# Patient Record
Sex: Female | Born: 1935 | Race: Black or African American | Hispanic: No | Marital: Married | Smoking: Never smoker
Health system: Southern US, Community
[De-identification: ages and names within clinical notes are randomized; demographics above are authoritative.]

## PROBLEM LIST (undated history)

## (undated) DIAGNOSIS — I10 Essential (primary) hypertension: Secondary | ICD-10-CM

---

## 2015-12-20 ENCOUNTER — Encounter (INDEPENDENT_AMBULATORY_CARE_PROVIDER_SITE_OTHER): Payer: Medicare HMO | Admitting: Ophthalmology

## 2015-12-20 DIAGNOSIS — E113291 Type 2 diabetes mellitus with mild nonproliferative diabetic retinopathy without macular edema, right eye: Secondary | ICD-10-CM

## 2015-12-20 DIAGNOSIS — E113312 Type 2 diabetes mellitus with moderate nonproliferative diabetic retinopathy with macular edema, left eye: Secondary | ICD-10-CM | POA: Diagnosis not present

## 2015-12-20 DIAGNOSIS — E11311 Type 2 diabetes mellitus with unspecified diabetic retinopathy with macular edema: Secondary | ICD-10-CM | POA: Diagnosis not present

## 2015-12-20 DIAGNOSIS — H43813 Vitreous degeneration, bilateral: Secondary | ICD-10-CM

## 2015-12-20 DIAGNOSIS — I10 Essential (primary) hypertension: Secondary | ICD-10-CM | POA: Diagnosis not present

## 2015-12-20 DIAGNOSIS — H353111 Nonexudative age-related macular degeneration, right eye, early dry stage: Secondary | ICD-10-CM

## 2015-12-20 DIAGNOSIS — H35033 Hypertensive retinopathy, bilateral: Secondary | ICD-10-CM | POA: Diagnosis not present

## 2016-01-17 ENCOUNTER — Encounter (INDEPENDENT_AMBULATORY_CARE_PROVIDER_SITE_OTHER): Payer: Medicare HMO | Admitting: Ophthalmology

## 2016-01-17 DIAGNOSIS — I10 Essential (primary) hypertension: Secondary | ICD-10-CM | POA: Diagnosis not present

## 2016-01-17 DIAGNOSIS — H35033 Hypertensive retinopathy, bilateral: Secondary | ICD-10-CM

## 2016-01-17 DIAGNOSIS — H43813 Vitreous degeneration, bilateral: Secondary | ICD-10-CM

## 2016-01-17 DIAGNOSIS — E11311 Type 2 diabetes mellitus with unspecified diabetic retinopathy with macular edema: Secondary | ICD-10-CM | POA: Diagnosis not present

## 2016-01-17 DIAGNOSIS — E113312 Type 2 diabetes mellitus with moderate nonproliferative diabetic retinopathy with macular edema, left eye: Secondary | ICD-10-CM

## 2016-01-17 DIAGNOSIS — E113291 Type 2 diabetes mellitus with mild nonproliferative diabetic retinopathy without macular edema, right eye: Secondary | ICD-10-CM

## 2016-02-15 ENCOUNTER — Encounter (INDEPENDENT_AMBULATORY_CARE_PROVIDER_SITE_OTHER): Payer: Medicare HMO | Admitting: Ophthalmology

## 2016-02-15 DIAGNOSIS — I10 Essential (primary) hypertension: Secondary | ICD-10-CM

## 2016-02-15 DIAGNOSIS — H43813 Vitreous degeneration, bilateral: Secondary | ICD-10-CM | POA: Diagnosis not present

## 2016-02-15 DIAGNOSIS — E11311 Type 2 diabetes mellitus with unspecified diabetic retinopathy with macular edema: Secondary | ICD-10-CM

## 2016-02-15 DIAGNOSIS — E113313 Type 2 diabetes mellitus with moderate nonproliferative diabetic retinopathy with macular edema, bilateral: Secondary | ICD-10-CM

## 2016-02-15 DIAGNOSIS — H35033 Hypertensive retinopathy, bilateral: Secondary | ICD-10-CM

## 2016-03-17 ENCOUNTER — Encounter (INDEPENDENT_AMBULATORY_CARE_PROVIDER_SITE_OTHER): Payer: Medicare HMO | Admitting: Ophthalmology

## 2016-03-17 DIAGNOSIS — I10 Essential (primary) hypertension: Secondary | ICD-10-CM | POA: Diagnosis not present

## 2016-03-17 DIAGNOSIS — E113312 Type 2 diabetes mellitus with moderate nonproliferative diabetic retinopathy with macular edema, left eye: Secondary | ICD-10-CM | POA: Diagnosis not present

## 2016-03-17 DIAGNOSIS — E11311 Type 2 diabetes mellitus with unspecified diabetic retinopathy with macular edema: Secondary | ICD-10-CM | POA: Diagnosis not present

## 2016-03-17 DIAGNOSIS — E113291 Type 2 diabetes mellitus with mild nonproliferative diabetic retinopathy without macular edema, right eye: Secondary | ICD-10-CM | POA: Diagnosis not present

## 2016-03-17 DIAGNOSIS — H35033 Hypertensive retinopathy, bilateral: Secondary | ICD-10-CM

## 2016-03-17 DIAGNOSIS — H43813 Vitreous degeneration, bilateral: Secondary | ICD-10-CM

## 2016-04-14 ENCOUNTER — Encounter (INDEPENDENT_AMBULATORY_CARE_PROVIDER_SITE_OTHER): Payer: Medicare HMO | Admitting: Ophthalmology

## 2016-04-14 DIAGNOSIS — I10 Essential (primary) hypertension: Secondary | ICD-10-CM

## 2016-04-14 DIAGNOSIS — E113312 Type 2 diabetes mellitus with moderate nonproliferative diabetic retinopathy with macular edema, left eye: Secondary | ICD-10-CM

## 2016-04-14 DIAGNOSIS — H43813 Vitreous degeneration, bilateral: Secondary | ICD-10-CM | POA: Diagnosis not present

## 2016-04-14 DIAGNOSIS — E11311 Type 2 diabetes mellitus with unspecified diabetic retinopathy with macular edema: Secondary | ICD-10-CM

## 2016-04-14 DIAGNOSIS — H35033 Hypertensive retinopathy, bilateral: Secondary | ICD-10-CM

## 2016-04-14 DIAGNOSIS — E113291 Type 2 diabetes mellitus with mild nonproliferative diabetic retinopathy without macular edema, right eye: Secondary | ICD-10-CM | POA: Diagnosis not present

## 2016-05-12 ENCOUNTER — Encounter (INDEPENDENT_AMBULATORY_CARE_PROVIDER_SITE_OTHER): Payer: Medicare HMO | Admitting: Ophthalmology

## 2016-05-12 DIAGNOSIS — E113291 Type 2 diabetes mellitus with mild nonproliferative diabetic retinopathy without macular edema, right eye: Secondary | ICD-10-CM | POA: Diagnosis not present

## 2016-05-12 DIAGNOSIS — E11311 Type 2 diabetes mellitus with unspecified diabetic retinopathy with macular edema: Secondary | ICD-10-CM

## 2016-05-12 DIAGNOSIS — E113312 Type 2 diabetes mellitus with moderate nonproliferative diabetic retinopathy with macular edema, left eye: Secondary | ICD-10-CM | POA: Diagnosis not present

## 2016-05-12 DIAGNOSIS — H43813 Vitreous degeneration, bilateral: Secondary | ICD-10-CM

## 2016-05-12 DIAGNOSIS — I10 Essential (primary) hypertension: Secondary | ICD-10-CM

## 2016-05-12 DIAGNOSIS — H35033 Hypertensive retinopathy, bilateral: Secondary | ICD-10-CM

## 2016-06-23 ENCOUNTER — Encounter (INDEPENDENT_AMBULATORY_CARE_PROVIDER_SITE_OTHER): Payer: Medicare HMO | Admitting: Ophthalmology

## 2016-06-23 DIAGNOSIS — E11311 Type 2 diabetes mellitus with unspecified diabetic retinopathy with macular edema: Secondary | ICD-10-CM | POA: Diagnosis not present

## 2016-06-23 DIAGNOSIS — I10 Essential (primary) hypertension: Secondary | ICD-10-CM | POA: Diagnosis not present

## 2016-06-23 DIAGNOSIS — E113312 Type 2 diabetes mellitus with moderate nonproliferative diabetic retinopathy with macular edema, left eye: Secondary | ICD-10-CM

## 2016-06-23 DIAGNOSIS — H35033 Hypertensive retinopathy, bilateral: Secondary | ICD-10-CM | POA: Diagnosis not present

## 2016-06-23 DIAGNOSIS — E113211 Type 2 diabetes mellitus with mild nonproliferative diabetic retinopathy with macular edema, right eye: Secondary | ICD-10-CM | POA: Diagnosis not present

## 2016-06-23 DIAGNOSIS — H43813 Vitreous degeneration, bilateral: Secondary | ICD-10-CM | POA: Diagnosis not present

## 2016-08-07 ENCOUNTER — Encounter (INDEPENDENT_AMBULATORY_CARE_PROVIDER_SITE_OTHER): Payer: Medicare HMO | Admitting: Ophthalmology

## 2016-08-07 DIAGNOSIS — H43813 Vitreous degeneration, bilateral: Secondary | ICD-10-CM

## 2016-08-07 DIAGNOSIS — I10 Essential (primary) hypertension: Secondary | ICD-10-CM | POA: Diagnosis not present

## 2016-08-07 DIAGNOSIS — E11311 Type 2 diabetes mellitus with unspecified diabetic retinopathy with macular edema: Secondary | ICD-10-CM

## 2016-08-07 DIAGNOSIS — E113312 Type 2 diabetes mellitus with moderate nonproliferative diabetic retinopathy with macular edema, left eye: Secondary | ICD-10-CM | POA: Diagnosis not present

## 2016-08-07 DIAGNOSIS — H35033 Hypertensive retinopathy, bilateral: Secondary | ICD-10-CM

## 2016-08-07 DIAGNOSIS — E113211 Type 2 diabetes mellitus with mild nonproliferative diabetic retinopathy with macular edema, right eye: Secondary | ICD-10-CM | POA: Diagnosis not present

## 2016-09-21 ENCOUNTER — Encounter (INDEPENDENT_AMBULATORY_CARE_PROVIDER_SITE_OTHER): Payer: Medicare HMO | Admitting: Ophthalmology

## 2016-09-21 DIAGNOSIS — E11311 Type 2 diabetes mellitus with unspecified diabetic retinopathy with macular edema: Secondary | ICD-10-CM

## 2016-09-21 DIAGNOSIS — H35033 Hypertensive retinopathy, bilateral: Secondary | ICD-10-CM

## 2016-09-21 DIAGNOSIS — H43813 Vitreous degeneration, bilateral: Secondary | ICD-10-CM | POA: Diagnosis not present

## 2016-09-21 DIAGNOSIS — I10 Essential (primary) hypertension: Secondary | ICD-10-CM | POA: Diagnosis not present

## 2016-09-21 DIAGNOSIS — E113312 Type 2 diabetes mellitus with moderate nonproliferative diabetic retinopathy with macular edema, left eye: Secondary | ICD-10-CM

## 2016-11-02 ENCOUNTER — Encounter (INDEPENDENT_AMBULATORY_CARE_PROVIDER_SITE_OTHER): Payer: Medicare HMO | Admitting: Ophthalmology

## 2016-11-02 DIAGNOSIS — E113391 Type 2 diabetes mellitus with moderate nonproliferative diabetic retinopathy without macular edema, right eye: Secondary | ICD-10-CM | POA: Diagnosis not present

## 2016-11-02 DIAGNOSIS — H43813 Vitreous degeneration, bilateral: Secondary | ICD-10-CM | POA: Diagnosis not present

## 2016-11-02 DIAGNOSIS — E113312 Type 2 diabetes mellitus with moderate nonproliferative diabetic retinopathy with macular edema, left eye: Secondary | ICD-10-CM | POA: Diagnosis not present

## 2016-11-02 DIAGNOSIS — H35033 Hypertensive retinopathy, bilateral: Secondary | ICD-10-CM

## 2016-11-02 DIAGNOSIS — I10 Essential (primary) hypertension: Secondary | ICD-10-CM

## 2016-11-02 DIAGNOSIS — E11311 Type 2 diabetes mellitus with unspecified diabetic retinopathy with macular edema: Secondary | ICD-10-CM

## 2016-12-15 ENCOUNTER — Encounter (INDEPENDENT_AMBULATORY_CARE_PROVIDER_SITE_OTHER): Payer: Medicare HMO | Admitting: Ophthalmology

## 2016-12-15 DIAGNOSIS — E11311 Type 2 diabetes mellitus with unspecified diabetic retinopathy with macular edema: Secondary | ICD-10-CM | POA: Diagnosis not present

## 2016-12-15 DIAGNOSIS — E113312 Type 2 diabetes mellitus with moderate nonproliferative diabetic retinopathy with macular edema, left eye: Secondary | ICD-10-CM

## 2016-12-15 DIAGNOSIS — H35033 Hypertensive retinopathy, bilateral: Secondary | ICD-10-CM

## 2016-12-15 DIAGNOSIS — I10 Essential (primary) hypertension: Secondary | ICD-10-CM

## 2016-12-15 DIAGNOSIS — E113391 Type 2 diabetes mellitus with moderate nonproliferative diabetic retinopathy without macular edema, right eye: Secondary | ICD-10-CM | POA: Diagnosis not present

## 2016-12-15 DIAGNOSIS — H35371 Puckering of macula, right eye: Secondary | ICD-10-CM | POA: Diagnosis not present

## 2016-12-15 DIAGNOSIS — H43813 Vitreous degeneration, bilateral: Secondary | ICD-10-CM | POA: Diagnosis not present

## 2017-01-26 ENCOUNTER — Encounter (INDEPENDENT_AMBULATORY_CARE_PROVIDER_SITE_OTHER): Payer: Medicare HMO | Admitting: Ophthalmology

## 2017-02-01 ENCOUNTER — Encounter (INDEPENDENT_AMBULATORY_CARE_PROVIDER_SITE_OTHER): Payer: Medicare HMO | Admitting: Ophthalmology

## 2017-02-01 DIAGNOSIS — I10 Essential (primary) hypertension: Secondary | ICD-10-CM | POA: Diagnosis not present

## 2017-02-01 DIAGNOSIS — H43813 Vitreous degeneration, bilateral: Secondary | ICD-10-CM

## 2017-02-01 DIAGNOSIS — H35033 Hypertensive retinopathy, bilateral: Secondary | ICD-10-CM

## 2017-02-01 DIAGNOSIS — E113392 Type 2 diabetes mellitus with moderate nonproliferative diabetic retinopathy without macular edema, left eye: Secondary | ICD-10-CM

## 2017-02-01 DIAGNOSIS — E113291 Type 2 diabetes mellitus with mild nonproliferative diabetic retinopathy without macular edema, right eye: Secondary | ICD-10-CM

## 2017-02-01 DIAGNOSIS — E11311 Type 2 diabetes mellitus with unspecified diabetic retinopathy with macular edema: Secondary | ICD-10-CM | POA: Diagnosis not present

## 2017-03-15 ENCOUNTER — Encounter (INDEPENDENT_AMBULATORY_CARE_PROVIDER_SITE_OTHER): Payer: Medicare HMO | Admitting: Ophthalmology

## 2017-03-15 DIAGNOSIS — E113312 Type 2 diabetes mellitus with moderate nonproliferative diabetic retinopathy with macular edema, left eye: Secondary | ICD-10-CM | POA: Diagnosis not present

## 2017-03-15 DIAGNOSIS — H35033 Hypertensive retinopathy, bilateral: Secondary | ICD-10-CM | POA: Diagnosis not present

## 2017-03-15 DIAGNOSIS — I10 Essential (primary) hypertension: Secondary | ICD-10-CM | POA: Diagnosis not present

## 2017-03-15 DIAGNOSIS — E11311 Type 2 diabetes mellitus with unspecified diabetic retinopathy with macular edema: Secondary | ICD-10-CM

## 2017-03-15 DIAGNOSIS — E113291 Type 2 diabetes mellitus with mild nonproliferative diabetic retinopathy without macular edema, right eye: Secondary | ICD-10-CM

## 2017-03-15 DIAGNOSIS — H43813 Vitreous degeneration, bilateral: Secondary | ICD-10-CM | POA: Diagnosis not present

## 2017-04-26 ENCOUNTER — Encounter (INDEPENDENT_AMBULATORY_CARE_PROVIDER_SITE_OTHER): Payer: Medicare HMO | Admitting: Ophthalmology

## 2017-07-05 ENCOUNTER — Encounter (INDEPENDENT_AMBULATORY_CARE_PROVIDER_SITE_OTHER): Payer: Medicare HMO | Admitting: Ophthalmology

## 2017-07-05 DIAGNOSIS — E11311 Type 2 diabetes mellitus with unspecified diabetic retinopathy with macular edema: Secondary | ICD-10-CM | POA: Diagnosis not present

## 2017-07-05 DIAGNOSIS — E113291 Type 2 diabetes mellitus with mild nonproliferative diabetic retinopathy without macular edema, right eye: Secondary | ICD-10-CM

## 2017-07-05 DIAGNOSIS — I10 Essential (primary) hypertension: Secondary | ICD-10-CM

## 2017-07-05 DIAGNOSIS — H26493 Other secondary cataract, bilateral: Secondary | ICD-10-CM | POA: Diagnosis not present

## 2017-07-05 DIAGNOSIS — E113312 Type 2 diabetes mellitus with moderate nonproliferative diabetic retinopathy with macular edema, left eye: Secondary | ICD-10-CM

## 2017-07-05 DIAGNOSIS — H35033 Hypertensive retinopathy, bilateral: Secondary | ICD-10-CM

## 2017-07-05 DIAGNOSIS — H43813 Vitreous degeneration, bilateral: Secondary | ICD-10-CM

## 2017-08-29 ENCOUNTER — Encounter (INDEPENDENT_AMBULATORY_CARE_PROVIDER_SITE_OTHER): Payer: Medicare HMO | Admitting: Ophthalmology

## 2017-08-29 DIAGNOSIS — E113312 Type 2 diabetes mellitus with moderate nonproliferative diabetic retinopathy with macular edema, left eye: Secondary | ICD-10-CM | POA: Diagnosis not present

## 2017-08-29 DIAGNOSIS — H35033 Hypertensive retinopathy, bilateral: Secondary | ICD-10-CM | POA: Diagnosis not present

## 2017-08-29 DIAGNOSIS — I10 Essential (primary) hypertension: Secondary | ICD-10-CM

## 2017-08-29 DIAGNOSIS — H43813 Vitreous degeneration, bilateral: Secondary | ICD-10-CM | POA: Diagnosis not present

## 2017-08-29 DIAGNOSIS — E11311 Type 2 diabetes mellitus with unspecified diabetic retinopathy with macular edema: Secondary | ICD-10-CM

## 2017-08-29 DIAGNOSIS — E113291 Type 2 diabetes mellitus with mild nonproliferative diabetic retinopathy without macular edema, right eye: Secondary | ICD-10-CM | POA: Diagnosis not present

## 2017-09-26 ENCOUNTER — Encounter (INDEPENDENT_AMBULATORY_CARE_PROVIDER_SITE_OTHER): Payer: Medicare HMO | Admitting: Ophthalmology

## 2017-09-27 ENCOUNTER — Encounter (INDEPENDENT_AMBULATORY_CARE_PROVIDER_SITE_OTHER): Payer: Medicare HMO | Admitting: Ophthalmology

## 2017-10-26 ENCOUNTER — Encounter (INDEPENDENT_AMBULATORY_CARE_PROVIDER_SITE_OTHER): Payer: Medicare HMO | Admitting: Ophthalmology

## 2017-10-26 DIAGNOSIS — H43813 Vitreous degeneration, bilateral: Secondary | ICD-10-CM | POA: Diagnosis not present

## 2017-10-26 DIAGNOSIS — H35372 Puckering of macula, left eye: Secondary | ICD-10-CM

## 2017-10-26 DIAGNOSIS — E11311 Type 2 diabetes mellitus with unspecified diabetic retinopathy with macular edema: Secondary | ICD-10-CM | POA: Diagnosis not present

## 2017-10-26 DIAGNOSIS — H35033 Hypertensive retinopathy, bilateral: Secondary | ICD-10-CM | POA: Diagnosis not present

## 2017-10-26 DIAGNOSIS — I10 Essential (primary) hypertension: Secondary | ICD-10-CM | POA: Diagnosis not present

## 2017-10-26 DIAGNOSIS — E113291 Type 2 diabetes mellitus with mild nonproliferative diabetic retinopathy without macular edema, right eye: Secondary | ICD-10-CM

## 2017-10-26 DIAGNOSIS — E113312 Type 2 diabetes mellitus with moderate nonproliferative diabetic retinopathy with macular edema, left eye: Secondary | ICD-10-CM

## 2017-12-07 ENCOUNTER — Encounter (INDEPENDENT_AMBULATORY_CARE_PROVIDER_SITE_OTHER): Payer: Medicare HMO | Admitting: Ophthalmology

## 2017-12-07 DIAGNOSIS — I10 Essential (primary) hypertension: Secondary | ICD-10-CM | POA: Diagnosis not present

## 2017-12-07 DIAGNOSIS — H26491 Other secondary cataract, right eye: Secondary | ICD-10-CM

## 2017-12-07 DIAGNOSIS — E113291 Type 2 diabetes mellitus with mild nonproliferative diabetic retinopathy without macular edema, right eye: Secondary | ICD-10-CM

## 2017-12-07 DIAGNOSIS — H35033 Hypertensive retinopathy, bilateral: Secondary | ICD-10-CM

## 2017-12-07 DIAGNOSIS — E113212 Type 2 diabetes mellitus with mild nonproliferative diabetic retinopathy with macular edema, left eye: Secondary | ICD-10-CM | POA: Diagnosis not present

## 2017-12-07 DIAGNOSIS — E11311 Type 2 diabetes mellitus with unspecified diabetic retinopathy with macular edema: Secondary | ICD-10-CM

## 2017-12-07 DIAGNOSIS — H43813 Vitreous degeneration, bilateral: Secondary | ICD-10-CM

## 2017-12-19 ENCOUNTER — Encounter (INDEPENDENT_AMBULATORY_CARE_PROVIDER_SITE_OTHER): Payer: Medicare HMO | Admitting: Ophthalmology

## 2018-01-18 ENCOUNTER — Encounter (INDEPENDENT_AMBULATORY_CARE_PROVIDER_SITE_OTHER): Payer: Medicare HMO | Admitting: Ophthalmology

## 2018-01-18 DIAGNOSIS — I10 Essential (primary) hypertension: Secondary | ICD-10-CM

## 2018-01-18 DIAGNOSIS — E113312 Type 2 diabetes mellitus with moderate nonproliferative diabetic retinopathy with macular edema, left eye: Secondary | ICD-10-CM | POA: Diagnosis not present

## 2018-01-18 DIAGNOSIS — H35033 Hypertensive retinopathy, bilateral: Secondary | ICD-10-CM

## 2018-01-18 DIAGNOSIS — E113291 Type 2 diabetes mellitus with mild nonproliferative diabetic retinopathy without macular edema, right eye: Secondary | ICD-10-CM | POA: Diagnosis not present

## 2018-01-18 DIAGNOSIS — E11311 Type 2 diabetes mellitus with unspecified diabetic retinopathy with macular edema: Secondary | ICD-10-CM

## 2018-01-18 DIAGNOSIS — H26493 Other secondary cataract, bilateral: Secondary | ICD-10-CM

## 2018-01-18 DIAGNOSIS — H43813 Vitreous degeneration, bilateral: Secondary | ICD-10-CM

## 2018-02-15 ENCOUNTER — Encounter (INDEPENDENT_AMBULATORY_CARE_PROVIDER_SITE_OTHER): Payer: Medicare HMO | Admitting: Ophthalmology

## 2018-02-15 DIAGNOSIS — E11319 Type 2 diabetes mellitus with unspecified diabetic retinopathy without macular edema: Secondary | ICD-10-CM

## 2018-02-15 DIAGNOSIS — H2701 Aphakia, right eye: Secondary | ICD-10-CM

## 2018-02-15 DIAGNOSIS — E113392 Type 2 diabetes mellitus with moderate nonproliferative diabetic retinopathy without macular edema, left eye: Secondary | ICD-10-CM

## 2018-03-18 ENCOUNTER — Encounter (INDEPENDENT_AMBULATORY_CARE_PROVIDER_SITE_OTHER): Payer: Medicare HMO | Admitting: Ophthalmology

## 2018-03-19 ENCOUNTER — Encounter (INDEPENDENT_AMBULATORY_CARE_PROVIDER_SITE_OTHER): Payer: Medicare HMO | Admitting: Ophthalmology

## 2018-03-19 DIAGNOSIS — E113291 Type 2 diabetes mellitus with mild nonproliferative diabetic retinopathy without macular edema, right eye: Secondary | ICD-10-CM

## 2018-03-19 DIAGNOSIS — I10 Essential (primary) hypertension: Secondary | ICD-10-CM | POA: Diagnosis not present

## 2018-03-19 DIAGNOSIS — H35372 Puckering of macula, left eye: Secondary | ICD-10-CM

## 2018-03-19 DIAGNOSIS — H43813 Vitreous degeneration, bilateral: Secondary | ICD-10-CM

## 2018-03-19 DIAGNOSIS — E11311 Type 2 diabetes mellitus with unspecified diabetic retinopathy with macular edema: Secondary | ICD-10-CM

## 2018-03-19 DIAGNOSIS — E113312 Type 2 diabetes mellitus with moderate nonproliferative diabetic retinopathy with macular edema, left eye: Secondary | ICD-10-CM

## 2018-03-19 DIAGNOSIS — H35033 Hypertensive retinopathy, bilateral: Secondary | ICD-10-CM

## 2018-04-16 ENCOUNTER — Encounter (INDEPENDENT_AMBULATORY_CARE_PROVIDER_SITE_OTHER): Payer: Medicare HMO | Admitting: Ophthalmology

## 2018-04-26 ENCOUNTER — Encounter (INDEPENDENT_AMBULATORY_CARE_PROVIDER_SITE_OTHER): Payer: Medicare HMO | Admitting: Ophthalmology

## 2018-06-27 ENCOUNTER — Encounter (INDEPENDENT_AMBULATORY_CARE_PROVIDER_SITE_OTHER): Payer: Medicare HMO | Admitting: Ophthalmology

## 2018-06-28 ENCOUNTER — Encounter (INDEPENDENT_AMBULATORY_CARE_PROVIDER_SITE_OTHER): Payer: Medicare HMO | Admitting: Ophthalmology

## 2018-06-28 DIAGNOSIS — I10 Essential (primary) hypertension: Secondary | ICD-10-CM

## 2018-06-28 DIAGNOSIS — H35033 Hypertensive retinopathy, bilateral: Secondary | ICD-10-CM

## 2018-06-28 DIAGNOSIS — E113312 Type 2 diabetes mellitus with moderate nonproliferative diabetic retinopathy with macular edema, left eye: Secondary | ICD-10-CM

## 2018-06-28 DIAGNOSIS — E113291 Type 2 diabetes mellitus with mild nonproliferative diabetic retinopathy without macular edema, right eye: Secondary | ICD-10-CM

## 2018-06-28 DIAGNOSIS — E11311 Type 2 diabetes mellitus with unspecified diabetic retinopathy with macular edema: Secondary | ICD-10-CM

## 2018-06-28 DIAGNOSIS — H43813 Vitreous degeneration, bilateral: Secondary | ICD-10-CM

## 2018-09-13 ENCOUNTER — Encounter (INDEPENDENT_AMBULATORY_CARE_PROVIDER_SITE_OTHER): Payer: Medicare HMO | Admitting: Ophthalmology

## 2018-09-13 ENCOUNTER — Other Ambulatory Visit: Payer: Self-pay

## 2018-09-13 DIAGNOSIS — I10 Essential (primary) hypertension: Secondary | ICD-10-CM | POA: Diagnosis not present

## 2018-09-13 DIAGNOSIS — E113312 Type 2 diabetes mellitus with moderate nonproliferative diabetic retinopathy with macular edema, left eye: Secondary | ICD-10-CM | POA: Diagnosis not present

## 2018-09-13 DIAGNOSIS — H43813 Vitreous degeneration, bilateral: Secondary | ICD-10-CM

## 2018-09-13 DIAGNOSIS — E11311 Type 2 diabetes mellitus with unspecified diabetic retinopathy with macular edema: Secondary | ICD-10-CM | POA: Diagnosis not present

## 2018-09-13 DIAGNOSIS — E113391 Type 2 diabetes mellitus with moderate nonproliferative diabetic retinopathy without macular edema, right eye: Secondary | ICD-10-CM | POA: Diagnosis not present

## 2018-09-13 DIAGNOSIS — H35033 Hypertensive retinopathy, bilateral: Secondary | ICD-10-CM

## 2019-03-17 ENCOUNTER — Encounter (INDEPENDENT_AMBULATORY_CARE_PROVIDER_SITE_OTHER): Payer: Medicare HMO | Admitting: Ophthalmology

## 2019-03-17 ENCOUNTER — Other Ambulatory Visit: Payer: Self-pay

## 2019-03-17 DIAGNOSIS — E113312 Type 2 diabetes mellitus with moderate nonproliferative diabetic retinopathy with macular edema, left eye: Secondary | ICD-10-CM | POA: Diagnosis not present

## 2019-03-17 DIAGNOSIS — E113291 Type 2 diabetes mellitus with mild nonproliferative diabetic retinopathy without macular edema, right eye: Secondary | ICD-10-CM | POA: Diagnosis not present

## 2019-03-17 DIAGNOSIS — E11311 Type 2 diabetes mellitus with unspecified diabetic retinopathy with macular edema: Secondary | ICD-10-CM

## 2019-03-17 DIAGNOSIS — H35033 Hypertensive retinopathy, bilateral: Secondary | ICD-10-CM

## 2019-03-17 DIAGNOSIS — H43813 Vitreous degeneration, bilateral: Secondary | ICD-10-CM

## 2019-03-17 DIAGNOSIS — I10 Essential (primary) hypertension: Secondary | ICD-10-CM | POA: Diagnosis not present

## 2019-05-05 ENCOUNTER — Encounter (INDEPENDENT_AMBULATORY_CARE_PROVIDER_SITE_OTHER): Payer: Medicare HMO | Admitting: Ophthalmology

## 2019-05-05 ENCOUNTER — Other Ambulatory Visit: Payer: Self-pay

## 2019-05-05 DIAGNOSIS — I10 Essential (primary) hypertension: Secondary | ICD-10-CM | POA: Diagnosis not present

## 2019-05-05 DIAGNOSIS — E11311 Type 2 diabetes mellitus with unspecified diabetic retinopathy with macular edema: Secondary | ICD-10-CM | POA: Diagnosis not present

## 2019-05-05 DIAGNOSIS — E113291 Type 2 diabetes mellitus with mild nonproliferative diabetic retinopathy without macular edema, right eye: Secondary | ICD-10-CM

## 2019-05-05 DIAGNOSIS — E113212 Type 2 diabetes mellitus with mild nonproliferative diabetic retinopathy with macular edema, left eye: Secondary | ICD-10-CM

## 2019-05-05 DIAGNOSIS — E113211 Type 2 diabetes mellitus with mild nonproliferative diabetic retinopathy with macular edema, right eye: Secondary | ICD-10-CM | POA: Diagnosis not present

## 2019-05-05 DIAGNOSIS — H35033 Hypertensive retinopathy, bilateral: Secondary | ICD-10-CM

## 2019-05-05 DIAGNOSIS — H43813 Vitreous degeneration, bilateral: Secondary | ICD-10-CM

## 2019-06-30 ENCOUNTER — Other Ambulatory Visit: Payer: Self-pay

## 2019-06-30 ENCOUNTER — Encounter (INDEPENDENT_AMBULATORY_CARE_PROVIDER_SITE_OTHER): Payer: Medicare HMO | Admitting: Ophthalmology

## 2019-06-30 DIAGNOSIS — E113291 Type 2 diabetes mellitus with mild nonproliferative diabetic retinopathy without macular edema, right eye: Secondary | ICD-10-CM | POA: Diagnosis not present

## 2019-06-30 DIAGNOSIS — E11311 Type 2 diabetes mellitus with unspecified diabetic retinopathy with macular edema: Secondary | ICD-10-CM

## 2019-06-30 DIAGNOSIS — E113312 Type 2 diabetes mellitus with moderate nonproliferative diabetic retinopathy with macular edema, left eye: Secondary | ICD-10-CM

## 2019-06-30 DIAGNOSIS — H43813 Vitreous degeneration, bilateral: Secondary | ICD-10-CM

## 2019-06-30 DIAGNOSIS — H35033 Hypertensive retinopathy, bilateral: Secondary | ICD-10-CM

## 2019-06-30 DIAGNOSIS — I10 Essential (primary) hypertension: Secondary | ICD-10-CM

## 2019-08-25 ENCOUNTER — Encounter (INDEPENDENT_AMBULATORY_CARE_PROVIDER_SITE_OTHER): Payer: Medicare HMO | Admitting: Ophthalmology

## 2019-08-25 DIAGNOSIS — E11311 Type 2 diabetes mellitus with unspecified diabetic retinopathy with macular edema: Secondary | ICD-10-CM

## 2019-08-25 DIAGNOSIS — I10 Essential (primary) hypertension: Secondary | ICD-10-CM | POA: Diagnosis not present

## 2019-08-25 DIAGNOSIS — H43813 Vitreous degeneration, bilateral: Secondary | ICD-10-CM

## 2019-08-25 DIAGNOSIS — H35033 Hypertensive retinopathy, bilateral: Secondary | ICD-10-CM

## 2019-08-25 DIAGNOSIS — E113291 Type 2 diabetes mellitus with mild nonproliferative diabetic retinopathy without macular edema, right eye: Secondary | ICD-10-CM | POA: Diagnosis not present

## 2019-08-25 DIAGNOSIS — E113212 Type 2 diabetes mellitus with mild nonproliferative diabetic retinopathy with macular edema, left eye: Secondary | ICD-10-CM

## 2019-09-11 ENCOUNTER — Ambulatory Visit: Payer: Self-pay | Attending: Internal Medicine

## 2019-09-22 ENCOUNTER — Encounter (INDEPENDENT_AMBULATORY_CARE_PROVIDER_SITE_OTHER): Payer: Medicare HMO | Admitting: Ophthalmology

## 2019-10-06 ENCOUNTER — Other Ambulatory Visit: Payer: Self-pay

## 2019-10-06 ENCOUNTER — Encounter (INDEPENDENT_AMBULATORY_CARE_PROVIDER_SITE_OTHER): Payer: Medicare HMO | Admitting: Ophthalmology

## 2019-10-06 DIAGNOSIS — E113291 Type 2 diabetes mellitus with mild nonproliferative diabetic retinopathy without macular edema, right eye: Secondary | ICD-10-CM

## 2019-10-06 DIAGNOSIS — E11311 Type 2 diabetes mellitus with unspecified diabetic retinopathy with macular edema: Secondary | ICD-10-CM | POA: Diagnosis not present

## 2019-10-06 DIAGNOSIS — I10 Essential (primary) hypertension: Secondary | ICD-10-CM

## 2019-10-06 DIAGNOSIS — E113312 Type 2 diabetes mellitus with moderate nonproliferative diabetic retinopathy with macular edema, left eye: Secondary | ICD-10-CM

## 2019-10-06 DIAGNOSIS — H43813 Vitreous degeneration, bilateral: Secondary | ICD-10-CM

## 2019-10-06 DIAGNOSIS — H35033 Hypertensive retinopathy, bilateral: Secondary | ICD-10-CM

## 2019-11-17 ENCOUNTER — Other Ambulatory Visit: Payer: Self-pay

## 2019-11-17 ENCOUNTER — Encounter (INDEPENDENT_AMBULATORY_CARE_PROVIDER_SITE_OTHER): Payer: Medicare HMO | Admitting: Ophthalmology

## 2019-11-17 DIAGNOSIS — E11311 Type 2 diabetes mellitus with unspecified diabetic retinopathy with macular edema: Secondary | ICD-10-CM | POA: Diagnosis not present

## 2019-11-17 DIAGNOSIS — E113291 Type 2 diabetes mellitus with mild nonproliferative diabetic retinopathy without macular edema, right eye: Secondary | ICD-10-CM | POA: Diagnosis not present

## 2019-11-17 DIAGNOSIS — I10 Essential (primary) hypertension: Secondary | ICD-10-CM | POA: Diagnosis not present

## 2019-11-17 DIAGNOSIS — H43813 Vitreous degeneration, bilateral: Secondary | ICD-10-CM

## 2019-11-17 DIAGNOSIS — E113312 Type 2 diabetes mellitus with moderate nonproliferative diabetic retinopathy with macular edema, left eye: Secondary | ICD-10-CM | POA: Diagnosis not present

## 2019-11-17 DIAGNOSIS — H35033 Hypertensive retinopathy, bilateral: Secondary | ICD-10-CM

## 2019-12-29 ENCOUNTER — Encounter (INDEPENDENT_AMBULATORY_CARE_PROVIDER_SITE_OTHER): Payer: Medicare HMO | Admitting: Ophthalmology

## 2020-01-05 ENCOUNTER — Encounter (INDEPENDENT_AMBULATORY_CARE_PROVIDER_SITE_OTHER): Payer: Medicare HMO | Admitting: Ophthalmology

## 2020-01-05 ENCOUNTER — Other Ambulatory Visit: Payer: Self-pay

## 2020-01-05 DIAGNOSIS — H43813 Vitreous degeneration, bilateral: Secondary | ICD-10-CM

## 2020-01-05 DIAGNOSIS — H35033 Hypertensive retinopathy, bilateral: Secondary | ICD-10-CM

## 2020-01-05 DIAGNOSIS — E113312 Type 2 diabetes mellitus with moderate nonproliferative diabetic retinopathy with macular edema, left eye: Secondary | ICD-10-CM

## 2020-01-05 DIAGNOSIS — I10 Essential (primary) hypertension: Secondary | ICD-10-CM

## 2020-01-05 DIAGNOSIS — E113291 Type 2 diabetes mellitus with mild nonproliferative diabetic retinopathy without macular edema, right eye: Secondary | ICD-10-CM

## 2020-01-05 DIAGNOSIS — E11311 Type 2 diabetes mellitus with unspecified diabetic retinopathy with macular edema: Secondary | ICD-10-CM | POA: Diagnosis not present

## 2020-03-01 ENCOUNTER — Encounter (INDEPENDENT_AMBULATORY_CARE_PROVIDER_SITE_OTHER): Payer: Medicare HMO | Admitting: Ophthalmology

## 2020-03-01 ENCOUNTER — Other Ambulatory Visit: Payer: Self-pay

## 2020-03-01 DIAGNOSIS — E113391 Type 2 diabetes mellitus with moderate nonproliferative diabetic retinopathy without macular edema, right eye: Secondary | ICD-10-CM | POA: Diagnosis not present

## 2020-03-01 DIAGNOSIS — I10 Essential (primary) hypertension: Secondary | ICD-10-CM

## 2020-03-01 DIAGNOSIS — E11311 Type 2 diabetes mellitus with unspecified diabetic retinopathy with macular edema: Secondary | ICD-10-CM | POA: Diagnosis not present

## 2020-03-01 DIAGNOSIS — E113312 Type 2 diabetes mellitus with moderate nonproliferative diabetic retinopathy with macular edema, left eye: Secondary | ICD-10-CM

## 2020-03-01 DIAGNOSIS — H35033 Hypertensive retinopathy, bilateral: Secondary | ICD-10-CM

## 2020-03-01 DIAGNOSIS — H43813 Vitreous degeneration, bilateral: Secondary | ICD-10-CM

## 2020-05-17 ENCOUNTER — Other Ambulatory Visit: Payer: Self-pay

## 2020-05-17 ENCOUNTER — Encounter (INDEPENDENT_AMBULATORY_CARE_PROVIDER_SITE_OTHER): Payer: Medicare HMO | Admitting: Ophthalmology

## 2020-05-17 DIAGNOSIS — I10 Essential (primary) hypertension: Secondary | ICD-10-CM | POA: Diagnosis not present

## 2020-05-17 DIAGNOSIS — H43813 Vitreous degeneration, bilateral: Secondary | ICD-10-CM

## 2020-05-17 DIAGNOSIS — E11311 Type 2 diabetes mellitus with unspecified diabetic retinopathy with macular edema: Secondary | ICD-10-CM

## 2020-05-17 DIAGNOSIS — E113391 Type 2 diabetes mellitus with moderate nonproliferative diabetic retinopathy without macular edema, right eye: Secondary | ICD-10-CM

## 2020-05-17 DIAGNOSIS — E113312 Type 2 diabetes mellitus with moderate nonproliferative diabetic retinopathy with macular edema, left eye: Secondary | ICD-10-CM

## 2020-05-17 DIAGNOSIS — H35033 Hypertensive retinopathy, bilateral: Secondary | ICD-10-CM

## 2020-08-09 ENCOUNTER — Other Ambulatory Visit: Payer: Self-pay

## 2020-08-09 ENCOUNTER — Encounter (INDEPENDENT_AMBULATORY_CARE_PROVIDER_SITE_OTHER): Payer: Medicare HMO | Admitting: Ophthalmology

## 2020-08-09 DIAGNOSIS — I10 Essential (primary) hypertension: Secondary | ICD-10-CM

## 2020-08-09 DIAGNOSIS — H43813 Vitreous degeneration, bilateral: Secondary | ICD-10-CM

## 2020-08-09 DIAGNOSIS — E113312 Type 2 diabetes mellitus with moderate nonproliferative diabetic retinopathy with macular edema, left eye: Secondary | ICD-10-CM | POA: Diagnosis not present

## 2020-08-09 DIAGNOSIS — H35033 Hypertensive retinopathy, bilateral: Secondary | ICD-10-CM

## 2020-08-09 DIAGNOSIS — E113391 Type 2 diabetes mellitus with moderate nonproliferative diabetic retinopathy without macular edema, right eye: Secondary | ICD-10-CM

## 2020-10-25 ENCOUNTER — Encounter (INDEPENDENT_AMBULATORY_CARE_PROVIDER_SITE_OTHER): Payer: Medicare HMO | Admitting: Ophthalmology

## 2020-11-10 ENCOUNTER — Other Ambulatory Visit: Payer: Self-pay

## 2020-11-10 ENCOUNTER — Encounter (INDEPENDENT_AMBULATORY_CARE_PROVIDER_SITE_OTHER): Payer: Medicare HMO | Admitting: Ophthalmology

## 2020-11-10 DIAGNOSIS — I10 Essential (primary) hypertension: Secondary | ICD-10-CM

## 2020-11-10 DIAGNOSIS — E113312 Type 2 diabetes mellitus with moderate nonproliferative diabetic retinopathy with macular edema, left eye: Secondary | ICD-10-CM | POA: Diagnosis not present

## 2020-11-10 DIAGNOSIS — H35033 Hypertensive retinopathy, bilateral: Secondary | ICD-10-CM | POA: Diagnosis not present

## 2020-11-10 DIAGNOSIS — H43813 Vitreous degeneration, bilateral: Secondary | ICD-10-CM

## 2020-11-10 DIAGNOSIS — E113391 Type 2 diabetes mellitus with moderate nonproliferative diabetic retinopathy without macular edema, right eye: Secondary | ICD-10-CM | POA: Diagnosis not present

## 2020-12-22 ENCOUNTER — Encounter (INDEPENDENT_AMBULATORY_CARE_PROVIDER_SITE_OTHER): Payer: Medicare HMO | Admitting: Ophthalmology

## 2020-12-22 ENCOUNTER — Other Ambulatory Visit: Payer: Self-pay

## 2020-12-22 DIAGNOSIS — H35033 Hypertensive retinopathy, bilateral: Secondary | ICD-10-CM

## 2020-12-22 DIAGNOSIS — H43813 Vitreous degeneration, bilateral: Secondary | ICD-10-CM

## 2020-12-22 DIAGNOSIS — I10 Essential (primary) hypertension: Secondary | ICD-10-CM

## 2020-12-22 DIAGNOSIS — E113312 Type 2 diabetes mellitus with moderate nonproliferative diabetic retinopathy with macular edema, left eye: Secondary | ICD-10-CM | POA: Diagnosis not present

## 2020-12-22 DIAGNOSIS — E113291 Type 2 diabetes mellitus with mild nonproliferative diabetic retinopathy without macular edema, right eye: Secondary | ICD-10-CM

## 2021-02-16 ENCOUNTER — Other Ambulatory Visit: Payer: Self-pay

## 2021-02-16 ENCOUNTER — Encounter (INDEPENDENT_AMBULATORY_CARE_PROVIDER_SITE_OTHER): Payer: Medicare HMO | Admitting: Ophthalmology

## 2021-02-16 DIAGNOSIS — I10 Essential (primary) hypertension: Secondary | ICD-10-CM

## 2021-02-16 DIAGNOSIS — E113391 Type 2 diabetes mellitus with moderate nonproliferative diabetic retinopathy without macular edema, right eye: Secondary | ICD-10-CM

## 2021-02-16 DIAGNOSIS — H43813 Vitreous degeneration, bilateral: Secondary | ICD-10-CM

## 2021-02-16 DIAGNOSIS — H35033 Hypertensive retinopathy, bilateral: Secondary | ICD-10-CM

## 2021-02-16 DIAGNOSIS — E113312 Type 2 diabetes mellitus with moderate nonproliferative diabetic retinopathy with macular edema, left eye: Secondary | ICD-10-CM | POA: Diagnosis not present

## 2021-04-27 ENCOUNTER — Encounter (INDEPENDENT_AMBULATORY_CARE_PROVIDER_SITE_OTHER): Payer: Medicare HMO | Admitting: Ophthalmology

## 2021-04-27 ENCOUNTER — Other Ambulatory Visit: Payer: Self-pay

## 2021-04-27 DIAGNOSIS — E113391 Type 2 diabetes mellitus with moderate nonproliferative diabetic retinopathy without macular edema, right eye: Secondary | ICD-10-CM | POA: Diagnosis not present

## 2021-04-27 DIAGNOSIS — H43813 Vitreous degeneration, bilateral: Secondary | ICD-10-CM

## 2021-04-27 DIAGNOSIS — I10 Essential (primary) hypertension: Secondary | ICD-10-CM | POA: Diagnosis not present

## 2021-04-27 DIAGNOSIS — H35033 Hypertensive retinopathy, bilateral: Secondary | ICD-10-CM | POA: Diagnosis not present

## 2021-04-27 DIAGNOSIS — E113312 Type 2 diabetes mellitus with moderate nonproliferative diabetic retinopathy with macular edema, left eye: Secondary | ICD-10-CM | POA: Diagnosis not present

## 2021-06-09 ENCOUNTER — Emergency Department (HOSPITAL_BASED_OUTPATIENT_CLINIC_OR_DEPARTMENT_OTHER): Payer: Medicare HMO

## 2021-06-09 ENCOUNTER — Encounter (HOSPITAL_BASED_OUTPATIENT_CLINIC_OR_DEPARTMENT_OTHER): Payer: Self-pay | Admitting: *Deleted

## 2021-06-09 ENCOUNTER — Emergency Department (HOSPITAL_BASED_OUTPATIENT_CLINIC_OR_DEPARTMENT_OTHER)
Admission: EM | Admit: 2021-06-09 | Discharge: 2021-06-10 | Disposition: A | Discharge status: Home / Self Care | Payer: Medicare HMO | Attending: Emergency Medicine | Admitting: Emergency Medicine

## 2021-06-09 ENCOUNTER — Other Ambulatory Visit: Payer: Self-pay

## 2021-06-09 DIAGNOSIS — J101 Influenza due to other identified influenza virus with other respiratory manifestations: Secondary | ICD-10-CM | POA: Diagnosis not present

## 2021-06-09 DIAGNOSIS — R059 Cough, unspecified: Secondary | ICD-10-CM | POA: Diagnosis present

## 2021-06-09 DIAGNOSIS — J111 Influenza due to unidentified influenza virus with other respiratory manifestations: Secondary | ICD-10-CM

## 2021-06-09 DIAGNOSIS — R197 Diarrhea, unspecified: Secondary | ICD-10-CM | POA: Insufficient documentation

## 2021-06-09 DIAGNOSIS — I10 Essential (primary) hypertension: Secondary | ICD-10-CM | POA: Insufficient documentation

## 2021-06-09 DIAGNOSIS — Z20822 Contact with and (suspected) exposure to covid-19: Secondary | ICD-10-CM | POA: Insufficient documentation

## 2021-06-09 DIAGNOSIS — I517 Cardiomegaly: Secondary | ICD-10-CM | POA: Diagnosis not present

## 2021-06-09 DIAGNOSIS — R0609 Other forms of dyspnea: Secondary | ICD-10-CM | POA: Diagnosis not present

## 2021-06-09 HISTORY — DX: Essential (primary) hypertension: I10

## 2021-06-09 LAB — BRAIN NATRIURETIC PEPTIDE: B Natriuretic Peptide: 3451.8 pg/mL — ABNORMAL HIGH (ref 0.0–100.0)

## 2021-06-09 LAB — CBC WITH DIFFERENTIAL/PLATELET
Abs Immature Granulocytes: 0.05 10*3/uL (ref 0.00–0.07)
Basophils Absolute: 0 10*3/uL (ref 0.0–0.1)
Basophils Relative: 0 %
Eosinophils Absolute: 0 10*3/uL (ref 0.0–0.5)
Eosinophils Relative: 0 %
HCT: 31.9 % — ABNORMAL LOW (ref 36.0–46.0)
Hemoglobin: 10.6 g/dL — ABNORMAL LOW (ref 12.0–15.0)
Immature Granulocytes: 1 %
Lymphocytes Relative: 7 %
Lymphs Abs: 0.5 10*3/uL — ABNORMAL LOW (ref 0.7–4.0)
MCH: 29.9 pg (ref 26.0–34.0)
MCHC: 33.2 g/dL (ref 30.0–36.0)
MCV: 89.9 fL (ref 80.0–100.0)
Monocytes Absolute: 0.6 10*3/uL (ref 0.1–1.0)
Monocytes Relative: 7 %
Neutro Abs: 6.3 10*3/uL (ref 1.7–7.7)
Neutrophils Relative %: 85 %
Platelets: 126 10*3/uL — ABNORMAL LOW (ref 150–400)
RBC: 3.55 MIL/uL — ABNORMAL LOW (ref 3.87–5.11)
RDW: 12.5 % (ref 11.5–15.5)
WBC: 7.4 10*3/uL (ref 4.0–10.5)
nRBC: 0 % (ref 0.0–0.2)

## 2021-06-09 LAB — RESP PANEL BY RT-PCR (FLU A&B, COVID) ARPGX2
Influenza A by PCR: POSITIVE — AB
Influenza B by PCR: NEGATIVE
SARS Coronavirus 2 by RT PCR: NEGATIVE

## 2021-06-09 LAB — BASIC METABOLIC PANEL
Anion gap: 11 (ref 5–15)
BUN: 30 mg/dL — ABNORMAL HIGH (ref 8–23)
CO2: 19 mmol/L — ABNORMAL LOW (ref 22–32)
Calcium: 8.9 mg/dL (ref 8.9–10.3)
Chloride: 100 mmol/L (ref 98–111)
Creatinine, Ser: 1.84 mg/dL — ABNORMAL HIGH (ref 0.44–1.00)
GFR, Estimated: 26 mL/min — ABNORMAL LOW (ref >60)
Glucose, Bld: 247 mg/dL — ABNORMAL HIGH (ref 70–99)
Potassium: 4.5 mmol/L (ref 3.5–5.1)
Sodium: 130 mmol/L — ABNORMAL LOW (ref 135–145)

## 2021-06-09 MED ORDER — LOPERAMIDE HCL 2 MG PO CAPS
4.0000 mg | ORAL_CAPSULE | Freq: Once | ORAL | Status: AC
  Administered 2021-06-09: 20:00:00 4 mg via ORAL
  Filled 2021-06-09: qty 2

## 2021-06-09 MED ORDER — FUROSEMIDE 20 MG PO TABS: 20.0000 mg | ORAL_TABLET | Freq: Every day | ORAL | 0 refills | Status: AC

## 2021-06-09 MED ORDER — DOXYCYCLINE HYCLATE 100 MG PO CAPS: 100.0000 mg | ORAL_CAPSULE | Freq: Two times a day (BID) | ORAL | 0 refills | Status: AC

## 2021-06-09 MED ORDER — SODIUM CHLORIDE 0.9 % IV SOLN
1.0000 g | Freq: Once | INTRAVENOUS | Status: AC
  Administered 2021-06-09: 23:00:00 1 g via INTRAVENOUS
  Filled 2021-06-09: qty 10

## 2021-06-09 MED ORDER — OSELTAMIVIR PHOSPHATE 30 MG PO CAPS: 30.0000 mg | ORAL_CAPSULE | Freq: Every day | ORAL | 0 refills | Status: AC

## 2021-06-09 MED ORDER — SODIUM CHLORIDE 0.9 % IV BOLUS
500.0000 mL | Freq: Once | INTRAVENOUS | Status: AC
  Administered 2021-06-09: 20:00:00 500 mL via INTRAVENOUS

## 2021-06-09 NOTE — ED Notes (Signed)
Pts blood work was obtain a rainbow of blood tubes were obtain.

## 2021-06-09 NOTE — ED Notes (Signed)
Family member updated on plan of care 

## 2021-06-09 NOTE — ED Provider Notes (Signed)
[facility] EMERGENCY DEPARTMENT Provider Note   CSN: [id number] Arrival date & time: 06/09/21  1713     History Chief Complaint  Patient presents with   Cough    Teresa Boone is a 87 y.o. female.  Patient presents ER with chief complaint of cough and diarrhea.  Cough is going on for days and episodes of diarrhea for about 2 days as well.  Denies any vomiting.  Denies chest pain.  Denies shortness of breath.      Past Medical History:  Diagnosis Date   Hypertension     There are no problems to display for this patient.   History reviewed. No pertinent surgical history.   OB History   No obstetric history on file.     No family history on file.  Social History   Tobacco Use   Smoking status: Never   Smokeless tobacco: Never  Substance Use Topics   Drug use: Not Currently    Home Medications Prior to Admission medications   Medication Sig Start Date End Date Taking? Authorizing Provider  doxycycline (VIBRAMYCIN) 100 MG capsule Take 1 capsule (100 mg total) by mouth 2 (two) times daily. 06/09/21  Yes Cheryll Cockayne, MD  furosemide (LASIX) 20 MG tablet Take 1 tablet (20 mg total) by mouth daily for 7 days. 06/09/21 06/16/21 Yes Cheryll Cockayne, MD  oseltamivir (TAMIFLU) 30 MG capsule Take 1 capsule (30 mg total) by mouth daily for 5 days. 06/09/21 06/14/21 Yes Cheryll Cockayne, MD    Allergies    Patient has no known allergies.  Review of Systems   Review of Systems  Constitutional:  Negative for fever.  HENT:  Negative for ear pain.   Eyes:  Negative for pain.  Respiratory:  Positive for cough.   Cardiovascular:  Negative for chest pain.  Gastrointestinal:  Negative for abdominal pain.  Genitourinary:  Negative for flank pain.  Musculoskeletal:  Negative for back pain.  Skin:  Negative for rash.  Neurological:  Negative for headaches.   Physical Exam Updated Vital Signs BP (!) 145/75 (BP Location: Left Arm)    Pulse 72    Temp 97.9 F (36.6  C) (Oral)    Resp 16    Ht 5\' 3"  (1.6 m)    Wt 67.6 kg    SpO2 97%    BMI 26.39 kg/m   Physical Exam Constitutional:      General: She is not in acute distress.    Appearance: Normal appearance.  HENT:     Head: Normocephalic.     Nose: Nose normal.  Eyes:     Extraocular Movements: Extraocular movements intact.  Cardiovascular:     Rate and Rhythm: Normal rate.  Pulmonary:     Effort: Pulmonary effort is normal.     Breath sounds: Normal breath sounds. No wheezing or rales.  Abdominal:     Tenderness: There is no abdominal tenderness. There is no guarding or rebound.  Musculoskeletal:        General: Normal range of motion.     Cervical back: Normal range of motion.     Right lower leg: No edema.     Left lower leg: No edema.  Neurological:     General: No focal deficit present.     Mental Status: She is alert. Mental status is at baseline.    ED Results / Procedures / Treatments   Labs (all labs ordered are listed, but only abnormal results are displayed) Labs  Reviewed  RESP PANEL BY RT-PCR (FLU A&B, COVID) ARPGX2 - Abnormal; Notable for the following components:      Result Value   Influenza A by PCR POSITIVE (*)    All other components within normal limits  CBC WITH DIFFERENTIAL/PLATELET - Abnormal; Notable for the following components:   RBC 3.55 (*)    Hemoglobin 10.6 (*)    HCT 31.9 (*)    Platelets 126 (*)    Lymphs Abs 0.5 (*)    All other components within normal limits  BRAIN NATRIURETIC PEPTIDE - Abnormal; Notable for the following components:   B Natriuretic Peptide 3,451.8 (*)    All other components within normal limits  BASIC METABOLIC PANEL - Abnormal; Notable for the following components:   Sodium 130 (*)    CO2 19 (*)    Glucose, Bld 247 (*)    BUN 30 (*)    Creatinine, Ser 1.84 (*)    GFR, Estimated 26 (*)    All other components within normal limits    EKG None  Radiology DG Chest Portable 1 View  Result Date: 06/09/2021 CLINICAL  DATA:  Cough for 4 days. Abdominal pain and diarrhea for 1 day. EXAM: PORTABLE CHEST 1 VIEW COMPARISON:  03/21/2020 FINDINGS: Shallow inspiration. Cardiac enlargement. Mild perihilar infiltration suggesting edema or multifocal pneumonia. Nodular opacity over the right infrahilar region is probably consolidative but follow-up after resolution of acute process is recommended to exclude underlying mass lesion. No pleural effusions. No pneumothorax. Mediastinal contours appear intact. Calcification of the aorta. IMPRESSION: Cardiac enlargement with perihilar infiltration suggesting edema or multifocal pneumonia. Masslike opacity in the right infrahilar region is probably consolidative but follow-up after resolution of acute process is recommended to exclude underlying focal lesion. Electronically Signed   By: Burman Nieves M.D.   On: 06/09/2021 22:15    Procedures Procedures   Medications Ordered in ED Medications  cefTRIAXone (ROCEPHIN) 1 g in sodium chloride 0.9 % 100 mL IVPB (has no administration in time range)  sodium chloride 0.9 % bolus 500 mL (0 mLs Intravenous Stopped 06/09/21 2054)  loperamide (IMODIUM) capsule 4 mg (4 mg Oral Given 06/09/21 1952)    ED Course  I have reviewed the triage vital signs and the nursing notes.  Pertinent labs & imaging results that were available during my care of the patient were reviewed by me and considered in my medical decision making (see chart for details).    MDM Rules/Calculators/A&P                         Labs are sent white count normal 7 hemoglobin of 10.  Chemistry shows creatinine of 1.8 BUN of 30.  No prior creatinine to compare this to.  proBNP elevated greater than 3000, no prior proBNP available as well.  Influenza test is positive for influenza A.  Chest x-ray concerning for multifocal pneumonia versus pulmonary edema versus masslike lesion.  Patient given empiric antibiotics here.  Vital signs remained stable without any oxygen  requirement.  Patient be discharged home stable condition, will advise outpatient follow-up with cardiology and her nephrologist this week.  Advised immediate return if she has worsening difficulty breathing fevers or any additional concerns.     Final Clinical Impression(s) / ED Diagnoses Final diagnoses:  Influenza-like illness  Enlarged heart    Rx / DC Orders ED Discharge Orders          Ordered    Ambulatory referral to  Cardiology        06/09/21 2245    furosemide (LASIX) 20 MG tablet  Daily        06/09/21 2247    oseltamivir (TAMIFLU) 30 MG capsule  Daily        06/09/21 2247    doxycycline (VIBRAMYCIN) 100 MG capsule  2 times daily        06/09/21 2248             Cheryll Cockayne, MD 06/09/21 2248

## 2021-06-09 NOTE — Discharge Instructions (Addendum)
Your test today showed influenza a.  Take the medications as written.  Follow-up with cardiology as well as your nephrologist.  You will need to call them to schedule appointment within 1 week.   Return immediately back to the ER if:  Your symptoms worsen within the next 12-24 hours. You develop new symptoms such as new fevers, persistent vomiting, new pain, shortness of breath, or new weakness or numbness, or if you have any other concerns.

## 2021-06-09 NOTE — ED Triage Notes (Addendum)
Brought in by EMS from home for cough x 4 days, abd pain and diarrhea x 1 day

## 2021-06-10 NOTE — ED Notes (Signed)
Patient's son called and made aware that patient is ready for d/c

## 2021-07-05 ENCOUNTER — Encounter (HOSPITAL_COMMUNITY): Payer: Self-pay | Admitting: Radiology

## 2021-07-20 ENCOUNTER — Encounter (INDEPENDENT_AMBULATORY_CARE_PROVIDER_SITE_OTHER): Payer: Medicare HMO | Admitting: Ophthalmology

## 2021-07-20 ENCOUNTER — Other Ambulatory Visit: Payer: Self-pay

## 2021-07-20 DIAGNOSIS — H43813 Vitreous degeneration, bilateral: Secondary | ICD-10-CM

## 2021-07-20 DIAGNOSIS — I10 Essential (primary) hypertension: Secondary | ICD-10-CM

## 2021-07-20 DIAGNOSIS — H35033 Hypertensive retinopathy, bilateral: Secondary | ICD-10-CM

## 2021-07-20 DIAGNOSIS — E113393 Type 2 diabetes mellitus with moderate nonproliferative diabetic retinopathy without macular edema, bilateral: Secondary | ICD-10-CM | POA: Diagnosis not present

## 2021-10-12 ENCOUNTER — Encounter (INDEPENDENT_AMBULATORY_CARE_PROVIDER_SITE_OTHER): Payer: Medicare HMO | Admitting: Ophthalmology

## 2021-10-31 ENCOUNTER — Encounter (INDEPENDENT_AMBULATORY_CARE_PROVIDER_SITE_OTHER): Payer: Medicare HMO | Admitting: Ophthalmology

## 2021-10-31 DIAGNOSIS — H43813 Vitreous degeneration, bilateral: Secondary | ICD-10-CM | POA: Diagnosis not present

## 2021-10-31 DIAGNOSIS — E113393 Type 2 diabetes mellitus with moderate nonproliferative diabetic retinopathy without macular edema, bilateral: Secondary | ICD-10-CM

## 2021-10-31 DIAGNOSIS — I10 Essential (primary) hypertension: Secondary | ICD-10-CM

## 2021-10-31 DIAGNOSIS — H35033 Hypertensive retinopathy, bilateral: Secondary | ICD-10-CM | POA: Diagnosis not present

## 2022-03-06 ENCOUNTER — Encounter (INDEPENDENT_AMBULATORY_CARE_PROVIDER_SITE_OTHER): Payer: Medicare HMO | Admitting: Ophthalmology

## 2022-03-06 DIAGNOSIS — H35033 Hypertensive retinopathy, bilateral: Secondary | ICD-10-CM | POA: Diagnosis not present

## 2022-03-06 DIAGNOSIS — E113311 Type 2 diabetes mellitus with moderate nonproliferative diabetic retinopathy with macular edema, right eye: Secondary | ICD-10-CM

## 2022-03-06 DIAGNOSIS — E113392 Type 2 diabetes mellitus with moderate nonproliferative diabetic retinopathy without macular edema, left eye: Secondary | ICD-10-CM

## 2022-03-06 DIAGNOSIS — I10 Essential (primary) hypertension: Secondary | ICD-10-CM

## 2022-03-06 DIAGNOSIS — H43813 Vitreous degeneration, bilateral: Secondary | ICD-10-CM

## 2022-07-10 ENCOUNTER — Encounter (INDEPENDENT_AMBULATORY_CARE_PROVIDER_SITE_OTHER): Payer: Medicare HMO | Admitting: Ophthalmology

## 2022-07-10 DIAGNOSIS — I10 Essential (primary) hypertension: Secondary | ICD-10-CM | POA: Diagnosis not present

## 2022-07-10 DIAGNOSIS — E113391 Type 2 diabetes mellitus with moderate nonproliferative diabetic retinopathy without macular edema, right eye: Secondary | ICD-10-CM | POA: Diagnosis not present

## 2022-07-10 DIAGNOSIS — E113312 Type 2 diabetes mellitus with moderate nonproliferative diabetic retinopathy with macular edema, left eye: Secondary | ICD-10-CM | POA: Diagnosis not present

## 2022-07-10 DIAGNOSIS — H35033 Hypertensive retinopathy, bilateral: Secondary | ICD-10-CM | POA: Diagnosis not present

## 2022-07-10 DIAGNOSIS — H43813 Vitreous degeneration, bilateral: Secondary | ICD-10-CM

## 2022-10-23 ENCOUNTER — Encounter (INDEPENDENT_AMBULATORY_CARE_PROVIDER_SITE_OTHER): Payer: Medicare HMO | Admitting: Ophthalmology

## 2022-11-21 ENCOUNTER — Encounter (INDEPENDENT_AMBULATORY_CARE_PROVIDER_SITE_OTHER): Payer: Medicare HMO | Admitting: Ophthalmology

## 2022-11-21 DIAGNOSIS — H35033 Hypertensive retinopathy, bilateral: Secondary | ICD-10-CM

## 2022-11-21 DIAGNOSIS — Z7984 Long term (current) use of oral hypoglycemic drugs: Secondary | ICD-10-CM

## 2022-11-21 DIAGNOSIS — I10 Essential (primary) hypertension: Secondary | ICD-10-CM

## 2022-11-21 DIAGNOSIS — E113312 Type 2 diabetes mellitus with moderate nonproliferative diabetic retinopathy with macular edema, left eye: Secondary | ICD-10-CM | POA: Diagnosis not present

## 2022-11-21 DIAGNOSIS — H43813 Vitreous degeneration, bilateral: Secondary | ICD-10-CM

## 2022-11-21 DIAGNOSIS — E113391 Type 2 diabetes mellitus with moderate nonproliferative diabetic retinopathy without macular edema, right eye: Secondary | ICD-10-CM | POA: Diagnosis not present

## 2023-03-02 ENCOUNTER — Encounter (INDEPENDENT_AMBULATORY_CARE_PROVIDER_SITE_OTHER): Payer: Medicare HMO | Admitting: Ophthalmology

## 2023-03-02 DIAGNOSIS — I10 Essential (primary) hypertension: Secondary | ICD-10-CM

## 2023-03-02 DIAGNOSIS — H43813 Vitreous degeneration, bilateral: Secondary | ICD-10-CM

## 2023-03-02 DIAGNOSIS — Z7984 Long term (current) use of oral hypoglycemic drugs: Secondary | ICD-10-CM | POA: Diagnosis not present

## 2023-03-02 DIAGNOSIS — E113312 Type 2 diabetes mellitus with moderate nonproliferative diabetic retinopathy with macular edema, left eye: Secondary | ICD-10-CM

## 2023-03-02 DIAGNOSIS — E113291 Type 2 diabetes mellitus with mild nonproliferative diabetic retinopathy without macular edema, right eye: Secondary | ICD-10-CM | POA: Diagnosis not present

## 2023-03-02 DIAGNOSIS — H35033 Hypertensive retinopathy, bilateral: Secondary | ICD-10-CM

## 2023-04-27 ENCOUNTER — Encounter (INDEPENDENT_AMBULATORY_CARE_PROVIDER_SITE_OTHER): Payer: Medicare HMO | Admitting: Ophthalmology

## 2023-04-27 DIAGNOSIS — E113291 Type 2 diabetes mellitus with mild nonproliferative diabetic retinopathy without macular edema, right eye: Secondary | ICD-10-CM

## 2023-04-27 DIAGNOSIS — H35033 Hypertensive retinopathy, bilateral: Secondary | ICD-10-CM

## 2023-04-27 DIAGNOSIS — Z7984 Long term (current) use of oral hypoglycemic drugs: Secondary | ICD-10-CM

## 2023-04-27 DIAGNOSIS — H43813 Vitreous degeneration, bilateral: Secondary | ICD-10-CM

## 2023-04-27 DIAGNOSIS — I10 Essential (primary) hypertension: Secondary | ICD-10-CM | POA: Diagnosis not present

## 2023-04-27 DIAGNOSIS — E113312 Type 2 diabetes mellitus with moderate nonproliferative diabetic retinopathy with macular edema, left eye: Secondary | ICD-10-CM | POA: Diagnosis not present

## 2023-07-18 ENCOUNTER — Encounter (INDEPENDENT_AMBULATORY_CARE_PROVIDER_SITE_OTHER): Payer: Medicare HMO | Admitting: Ophthalmology

## 2023-07-18 DIAGNOSIS — H43813 Vitreous degeneration, bilateral: Secondary | ICD-10-CM

## 2023-07-18 DIAGNOSIS — H35033 Hypertensive retinopathy, bilateral: Secondary | ICD-10-CM

## 2023-07-18 DIAGNOSIS — E113312 Type 2 diabetes mellitus with moderate nonproliferative diabetic retinopathy with macular edema, left eye: Secondary | ICD-10-CM | POA: Diagnosis not present

## 2023-07-18 DIAGNOSIS — I10 Essential (primary) hypertension: Secondary | ICD-10-CM

## 2023-07-18 DIAGNOSIS — E113391 Type 2 diabetes mellitus with moderate nonproliferative diabetic retinopathy without macular edema, right eye: Secondary | ICD-10-CM

## 2023-07-18 DIAGNOSIS — Z7984 Long term (current) use of oral hypoglycemic drugs: Secondary | ICD-10-CM | POA: Diagnosis not present

## 2023-09-05 ENCOUNTER — Encounter (INDEPENDENT_AMBULATORY_CARE_PROVIDER_SITE_OTHER): Payer: Medicare HMO | Admitting: Ophthalmology

## 2023-09-07 IMAGING — DX DG CHEST 1V PORT
1 series · 1 of 1 positions shown · non-contrast
Comparison: 03/21/2020

CLINICAL DATA: Cough for 4 days. Abdominal pain and diarrhea for 1
day.

EXAM:
PORTABLE CHEST 1 VIEW

[chest ap]
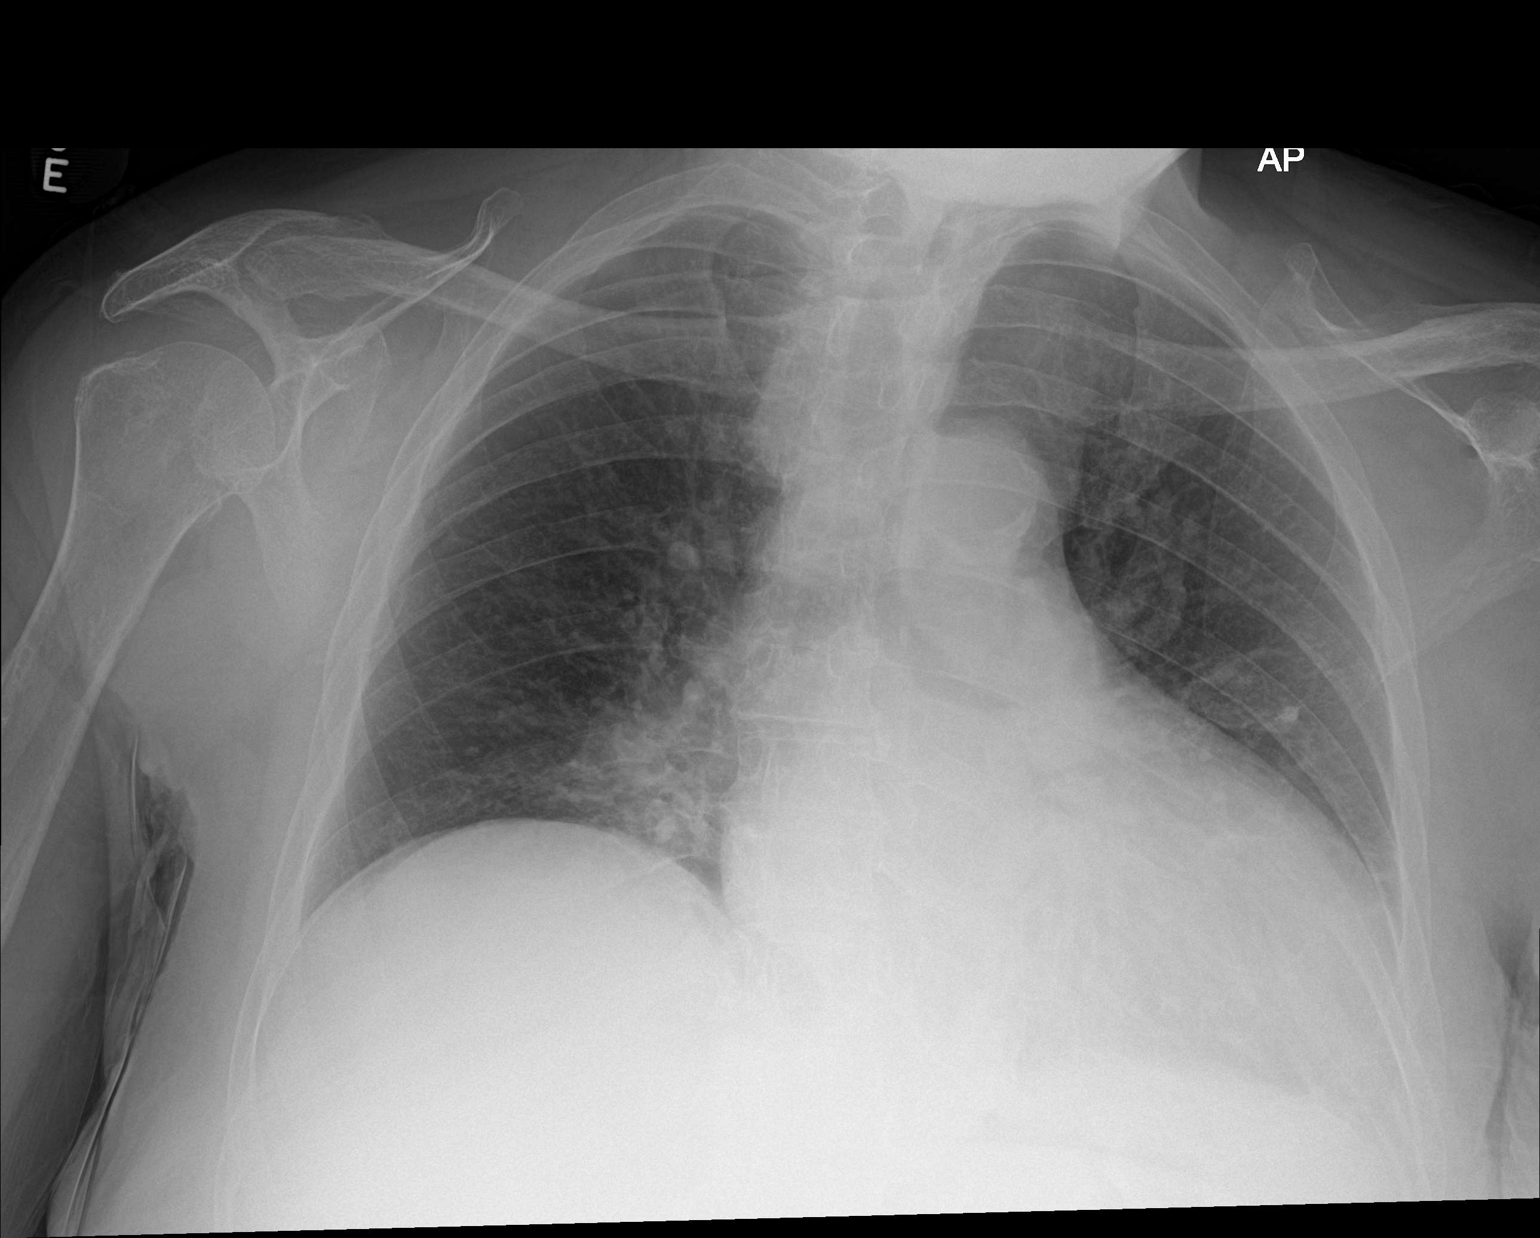

[1 of 1 positions shown; findings below may reference images not displayed]

FINDINGS: Shallow inspiration. Cardiac enlargement. Mild perihilar
infiltration suggesting edema or multifocal pneumonia. Nodular
opacity over the right infrahilar region is probably consolidative
but follow-up after resolution of acute process is recommended to
exclude underlying mass lesion. No pleural effusions. No
pneumothorax. Mediastinal contours appear intact. Calcification of
the aorta.
IMPRESSION: Cardiac enlargement with perihilar infiltration suggesting edema or
multifocal pneumonia. Masslike opacity in the right infrahilar
region is probably consolidative but follow-up after resolution of
acute process is recommended to exclude underlying focal lesion.

## 2023-09-12 ENCOUNTER — Encounter (INDEPENDENT_AMBULATORY_CARE_PROVIDER_SITE_OTHER): Admitting: Ophthalmology

## 2023-09-13 ENCOUNTER — Encounter (INDEPENDENT_AMBULATORY_CARE_PROVIDER_SITE_OTHER): Admitting: Ophthalmology

## 2023-09-13 DIAGNOSIS — E113312 Type 2 diabetes mellitus with moderate nonproliferative diabetic retinopathy with macular edema, left eye: Secondary | ICD-10-CM

## 2023-09-13 DIAGNOSIS — Z7984 Long term (current) use of oral hypoglycemic drugs: Secondary | ICD-10-CM | POA: Diagnosis not present

## 2023-09-13 DIAGNOSIS — E113391 Type 2 diabetes mellitus with moderate nonproliferative diabetic retinopathy without macular edema, right eye: Secondary | ICD-10-CM

## 2023-09-13 DIAGNOSIS — H43813 Vitreous degeneration, bilateral: Secondary | ICD-10-CM

## 2023-09-13 DIAGNOSIS — H35033 Hypertensive retinopathy, bilateral: Secondary | ICD-10-CM

## 2023-09-13 DIAGNOSIS — I10 Essential (primary) hypertension: Secondary | ICD-10-CM

## 2023-09-24 ENCOUNTER — Encounter (HOSPITAL_BASED_OUTPATIENT_CLINIC_OR_DEPARTMENT_OTHER): Payer: Self-pay

## 2023-09-24 ENCOUNTER — Emergency Department (HOSPITAL_BASED_OUTPATIENT_CLINIC_OR_DEPARTMENT_OTHER)
Admission: EM | Admit: 2023-09-24 | Discharge: 2023-09-25 | Disposition: A | Discharge status: Home / Self Care | Attending: Emergency Medicine | Admitting: Emergency Medicine

## 2023-09-24 ENCOUNTER — Emergency Department (HOSPITAL_BASED_OUTPATIENT_CLINIC_OR_DEPARTMENT_OTHER)

## 2023-09-24 ENCOUNTER — Other Ambulatory Visit: Payer: Self-pay

## 2023-09-24 DIAGNOSIS — J40 Bronchitis, not specified as acute or chronic: Secondary | ICD-10-CM | POA: Diagnosis not present

## 2023-09-24 DIAGNOSIS — I10 Essential (primary) hypertension: Secondary | ICD-10-CM | POA: Diagnosis not present

## 2023-09-24 DIAGNOSIS — R059 Cough, unspecified: Secondary | ICD-10-CM | POA: Diagnosis present

## 2023-09-24 LAB — RESP PANEL BY RT-PCR (RSV, FLU A&B, COVID)  RVPGX2
Influenza A by PCR: NEGATIVE
Influenza B by PCR: NEGATIVE
Resp Syncytial Virus by PCR: NEGATIVE
SARS Coronavirus 2 by RT PCR: NEGATIVE

## 2023-09-24 NOTE — ED Triage Notes (Signed)
 Pt states developed cough and congestion 3 days ago +chills and back pain Son does not report any other concerns

## 2023-09-25 MED ORDER — GUAIFENESIN ER 600 MG PO TB12: 600.0000 mg | ORAL_TABLET | Freq: Two times a day (BID) | ORAL | 0 refills | Status: AC | PRN

## 2023-09-25 MED ORDER — BENZONATATE 100 MG PO CAPS: 100.0000 mg | ORAL_CAPSULE | Freq: Three times a day (TID) | ORAL | 0 refills | Status: AC

## 2023-09-25 NOTE — Discharge Instructions (Signed)
 You were evaluated in the Emergency Department and after careful evaluation, we did not find any emergent condition requiring admission or further testing in the hospital.  Your exam/testing today is overall reassuring.  Symptoms likely due to a viral bronchitis.  Use the Mucinex to help clear your chest.  Can use the Tessalon to suppress your cough.  Recommend rest and fluids, Tylenol for discomfort.  Please return to the Emergency Department if you experience any worsening of your condition.   Thank you for allowing us  to be a part of your care.

## 2023-09-25 NOTE — ED Provider Notes (Signed)
 [facility]EMERGENCY DEPT [facility] Emergency Department Provider Note MRN:  [id number]  Arrival date & time: 09/25/23     Chief Complaint   URI   History of Present Illness   Teresa Boone is a 89 y.o. year-old female with a history of hypertension presenting to the ED with chief complaint of URI.  3 days of cough, malaise and fatigue, nasal congestion, chest congestion with green mucus.  Denies fever, no abdominal pain, no chest pain, no other complaints.  Denies shortness of breath.  Review of Systems  A thorough review of systems was obtained and all systems are negative except as noted in the HPI and PMH.   Patient's Health History    Past Medical History:  Diagnosis Date   Hypertension     History reviewed. No pertinent surgical history.  History reviewed. No pertinent family history.  Social History   Socioeconomic History   Marital status: Married    Spouse name: Not on file   Number of children: Not on file   Years of education: Not on file   Highest education level: Not on file  Occupational History   Not on file  Tobacco Use   Smoking status: Never   Smokeless tobacco: Never  Vaping Use   Vaping status: Never Used  Substance and Sexual Activity   Alcohol use: Not on file   Drug use: Not Currently   Sexual activity: Not Currently  Other Topics Concern   Not on file  Social History Narrative   Not on file   Social Drivers of Health   Financial Resource Strain: Not on file  Food Insecurity: Not on file  Transportation Needs: Not on file  Physical Activity: Not on file  Stress: Not on file  Social Connections: Not on file  Intimate Partner Violence: Not on file     Physical Exam   Vitals:   09/24/23 2153  BP: (!) 154/70  Pulse: 73  Resp: 18  Temp: 99 F (37.2 C)  SpO2: 98%    CONSTITUTIONAL: Well-appearing, NAD NEURO/PSYCH:  Alert and oriented x 3, no focal deficits EYES:  eyes equal and reactive ENT/NECK:  no LAD, no  JVD CARDIO: Regular rate, well-perfused, normal S1 and S2 PULM:  CTAB no wheezing or rhonchi GI/GU:  non-distended, non-tender MSK/SPINE:  No gross deformities, no edema SKIN:  no rash, atraumatic   *Additional and/or pertinent findings included in MDM below  Diagnostic and Interventional Summary    EKG Interpretation Date/Time:    Ventricular Rate:    PR Interval:    QRS Duration:    QT Interval:    QTC Calculation:   R Axis:      Text Interpretation:         Labs Reviewed  RESP PANEL BY RT-PCR (RSV, FLU A&B, COVID)  RVPGX2    DG Chest 2 View  Final Result      Medications - No data to display   Procedures  /  Critical Care Procedures  ED Course and Medical Decision Making  Initial Impression and Ddx Symptoms suggestive of viral illness versus bronchitis versus pneumonia.  Well-appearing in no acute distress with normal vital signs.  Minimal complaints at this time  Past medical/surgical history that increases complexity of ED encounter: Hypertension  Interpretation of Diagnostics I personally reviewed the Chest Xray and my interpretation is as follows: No lobar opacity or pneumothorax    Patient Reassessment and Ultimate Disposition/Management     With reassuring exam, vital signs,  minimal complaints and symptoms best explained by viral illness patient is appropriate for discharge.  Patient management required discussion with the following services or consulting groups:  None  Complexity of Problems Addressed Acute complicated illness or Injury  Additional Data Reviewed and Analyzed Further history obtained from: Further history from spouse/family member  Additional Factors Impacting ED Encounter Risk Prescriptions  Merrick Abe. Harless Lien, MD [facility] Emergency Medicine [facility] [email or web address]  Final Clinical Impressions(s) / ED Diagnoses     ICD-10-CM   1. Bronchitis  J40       ED Discharge Orders          Ordered     guaiFENesin (MUCINEX) 600 MG 12 hr tablet  2 times daily PRN        09/25/23 0052    benzonatate (TESSALON) 100 MG capsule  Every 8 hours        09/25/23 0052             Discharge Instructions Discussed with and Provided to Patient:    Discharge Instructions      You were evaluated in the Emergency Department and after careful evaluation, we did not find any emergent condition requiring admission or further testing in the hospital.  Your exam/testing today is overall reassuring.  Symptoms likely due to a viral bronchitis.  Use the Mucinex to help clear your chest.  Can use the Tessalon to suppress your cough.  Recommend rest and fluids, Tylenol for discomfort.  Please return to the Emergency Department if you experience any worsening of your condition.   Thank you for allowing us  to be a part of your care.      Edson Graces, MD 09/25/23 [phone]

## 2023-10-27 NOTE — Progress Notes (Signed)
 Teresa Boone. [name], MD [facility] Emergency Medicine [facility] [email or web address]

## 2023-10-31 ENCOUNTER — Encounter (INDEPENDENT_AMBULATORY_CARE_PROVIDER_SITE_OTHER): Admitting: Ophthalmology

## 2023-10-31 DIAGNOSIS — E113391 Type 2 diabetes mellitus with moderate nonproliferative diabetic retinopathy without macular edema, right eye: Secondary | ICD-10-CM

## 2023-10-31 DIAGNOSIS — E113312 Type 2 diabetes mellitus with moderate nonproliferative diabetic retinopathy with macular edema, left eye: Secondary | ICD-10-CM | POA: Diagnosis not present

## 2023-10-31 DIAGNOSIS — H35033 Hypertensive retinopathy, bilateral: Secondary | ICD-10-CM

## 2023-10-31 DIAGNOSIS — Z7984 Long term (current) use of oral hypoglycemic drugs: Secondary | ICD-10-CM | POA: Diagnosis not present

## 2023-10-31 DIAGNOSIS — I10 Essential (primary) hypertension: Secondary | ICD-10-CM | POA: Diagnosis not present

## 2023-10-31 DIAGNOSIS — H43813 Vitreous degeneration, bilateral: Secondary | ICD-10-CM

## 2023-12-12 ENCOUNTER — Encounter (INDEPENDENT_AMBULATORY_CARE_PROVIDER_SITE_OTHER): Admitting: Ophthalmology

## 2023-12-12 DIAGNOSIS — H43813 Vitreous degeneration, bilateral: Secondary | ICD-10-CM

## 2023-12-12 DIAGNOSIS — E113312 Type 2 diabetes mellitus with moderate nonproliferative diabetic retinopathy with macular edema, left eye: Secondary | ICD-10-CM | POA: Diagnosis not present

## 2023-12-12 DIAGNOSIS — E113291 Type 2 diabetes mellitus with mild nonproliferative diabetic retinopathy without macular edema, right eye: Secondary | ICD-10-CM | POA: Diagnosis not present

## 2023-12-12 DIAGNOSIS — Z7984 Long term (current) use of oral hypoglycemic drugs: Secondary | ICD-10-CM | POA: Diagnosis not present

## 2023-12-12 DIAGNOSIS — I10 Essential (primary) hypertension: Secondary | ICD-10-CM | POA: Diagnosis not present

## 2023-12-12 DIAGNOSIS — H35033 Hypertensive retinopathy, bilateral: Secondary | ICD-10-CM

## 2024-01-25 ENCOUNTER — Encounter (INDEPENDENT_AMBULATORY_CARE_PROVIDER_SITE_OTHER): Admitting: Ophthalmology

## 2024-01-25 DIAGNOSIS — I10 Essential (primary) hypertension: Secondary | ICD-10-CM | POA: Diagnosis not present

## 2024-01-25 DIAGNOSIS — H35033 Hypertensive retinopathy, bilateral: Secondary | ICD-10-CM

## 2024-01-25 DIAGNOSIS — Z7984 Long term (current) use of oral hypoglycemic drugs: Secondary | ICD-10-CM | POA: Diagnosis not present

## 2024-01-25 DIAGNOSIS — H43813 Vitreous degeneration, bilateral: Secondary | ICD-10-CM

## 2024-01-25 DIAGNOSIS — E113391 Type 2 diabetes mellitus with moderate nonproliferative diabetic retinopathy without macular edema, right eye: Secondary | ICD-10-CM | POA: Diagnosis not present

## 2024-01-25 DIAGNOSIS — E113312 Type 2 diabetes mellitus with moderate nonproliferative diabetic retinopathy with macular edema, left eye: Secondary | ICD-10-CM | POA: Diagnosis not present

## 2024-03-14 ENCOUNTER — Encounter (INDEPENDENT_AMBULATORY_CARE_PROVIDER_SITE_OTHER): Admitting: Ophthalmology

## 2024-03-14 DIAGNOSIS — H43813 Vitreous degeneration, bilateral: Secondary | ICD-10-CM

## 2024-03-14 DIAGNOSIS — E113312 Type 2 diabetes mellitus with moderate nonproliferative diabetic retinopathy with macular edema, left eye: Secondary | ICD-10-CM | POA: Diagnosis not present

## 2024-03-14 DIAGNOSIS — Z7984 Long term (current) use of oral hypoglycemic drugs: Secondary | ICD-10-CM

## 2024-03-14 DIAGNOSIS — I10 Essential (primary) hypertension: Secondary | ICD-10-CM | POA: Diagnosis not present

## 2024-03-14 DIAGNOSIS — E113391 Type 2 diabetes mellitus with moderate nonproliferative diabetic retinopathy without macular edema, right eye: Secondary | ICD-10-CM | POA: Diagnosis not present

## 2024-03-14 DIAGNOSIS — H35033 Hypertensive retinopathy, bilateral: Secondary | ICD-10-CM

## 2024-05-02 ENCOUNTER — Encounter (INDEPENDENT_AMBULATORY_CARE_PROVIDER_SITE_OTHER): Admitting: Ophthalmology

## 2024-05-02 DIAGNOSIS — H35033 Hypertensive retinopathy, bilateral: Secondary | ICD-10-CM

## 2024-05-02 DIAGNOSIS — I10 Essential (primary) hypertension: Secondary | ICD-10-CM | POA: Diagnosis not present

## 2024-05-02 DIAGNOSIS — E113312 Type 2 diabetes mellitus with moderate nonproliferative diabetic retinopathy with macular edema, left eye: Secondary | ICD-10-CM | POA: Diagnosis not present

## 2024-05-02 DIAGNOSIS — H43813 Vitreous degeneration, bilateral: Secondary | ICD-10-CM

## 2024-05-02 DIAGNOSIS — E113391 Type 2 diabetes mellitus with moderate nonproliferative diabetic retinopathy without macular edema, right eye: Secondary | ICD-10-CM

## 2024-05-02 DIAGNOSIS — Z7984 Long term (current) use of oral hypoglycemic drugs: Secondary | ICD-10-CM

## 2024-06-20 ENCOUNTER — Encounter (INDEPENDENT_AMBULATORY_CARE_PROVIDER_SITE_OTHER): Admitting: Ophthalmology

## 2024-06-20 DIAGNOSIS — H35033 Hypertensive retinopathy, bilateral: Secondary | ICD-10-CM | POA: Diagnosis not present

## 2024-06-20 DIAGNOSIS — H43813 Vitreous degeneration, bilateral: Secondary | ICD-10-CM | POA: Diagnosis not present

## 2024-06-20 DIAGNOSIS — Z7984 Long term (current) use of oral hypoglycemic drugs: Secondary | ICD-10-CM

## 2024-06-20 DIAGNOSIS — I10 Essential (primary) hypertension: Secondary | ICD-10-CM

## 2024-06-20 DIAGNOSIS — E113391 Type 2 diabetes mellitus with moderate nonproliferative diabetic retinopathy without macular edema, right eye: Secondary | ICD-10-CM | POA: Diagnosis not present

## 2024-06-20 DIAGNOSIS — E113312 Type 2 diabetes mellitus with moderate nonproliferative diabetic retinopathy with macular edema, left eye: Secondary | ICD-10-CM

## 2024-08-08 ENCOUNTER — Encounter (INDEPENDENT_AMBULATORY_CARE_PROVIDER_SITE_OTHER): Admitting: Ophthalmology
